# Patient Record
Sex: Male | Born: 1985 | Race: White | Hispanic: No | Marital: Married | Smoking: Never smoker
Health system: Southern US, Community
[De-identification: ages and names within clinical notes are randomized; demographics above are authoritative.]

## PROBLEM LIST (undated history)

## (undated) DIAGNOSIS — E079 Disorder of thyroid, unspecified: Secondary | ICD-10-CM

## (undated) DIAGNOSIS — F419 Anxiety disorder, unspecified: Secondary | ICD-10-CM

---
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
## 2005-04-04 ENCOUNTER — Emergency Department: Payer: Self-pay | Admitting: Emergency Medicine

## 2008-03-09 ENCOUNTER — Ambulatory Visit: Payer: Self-pay | Admitting: Internal Medicine

## 2008-03-09 ENCOUNTER — Inpatient Hospital Stay: Payer: Self-pay | Admitting: General Surgery

## 2009-07-09 ENCOUNTER — Ambulatory Visit: Payer: Self-pay | Admitting: Internal Medicine

## 2009-10-19 IMAGING — CT CT ABD-PELV W/ CM
1 of 2 series · 15 of 32 positions shown, 19 images · non-contrast
Comparison: none

REASON FOR EXAM: abd pain   acute appendicitis
COMMENTS:

[Series 2: soft tissue · axial · 0.73mm/px · z∈[+152,+620]mm · 15 of 170 slices shown, 19 images]
[im 7/170  soft-tissue]
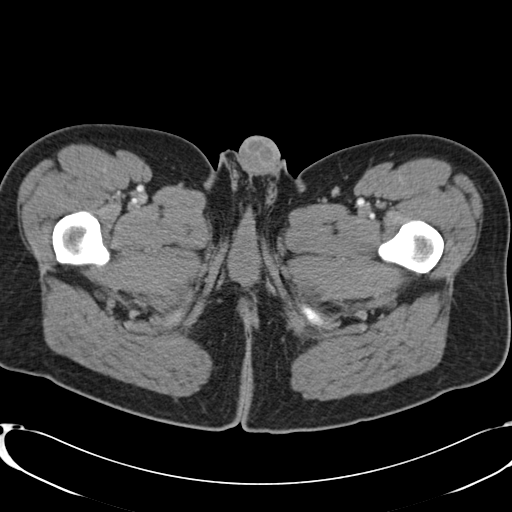
[im 7/170  bone]
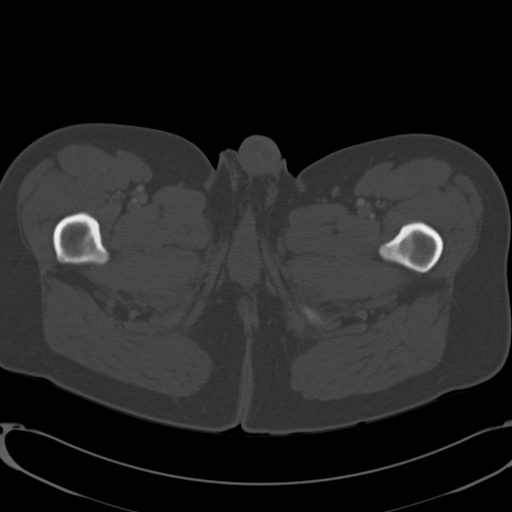
[im 20/170  soft-tissue]
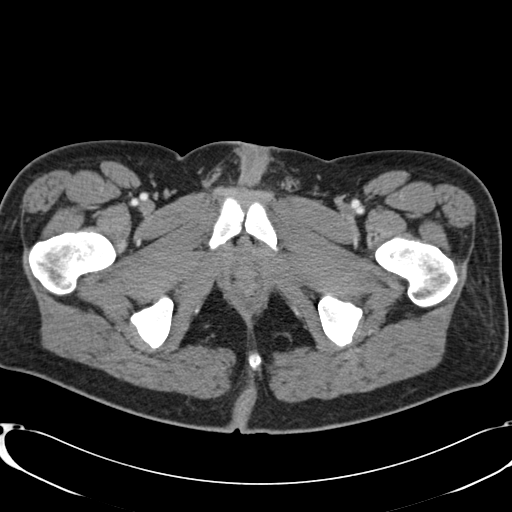
[im 33/170  soft-tissue]
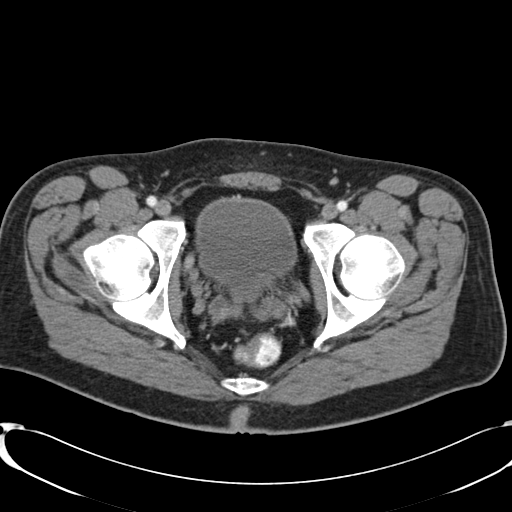
[im 46/170  soft-tissue]
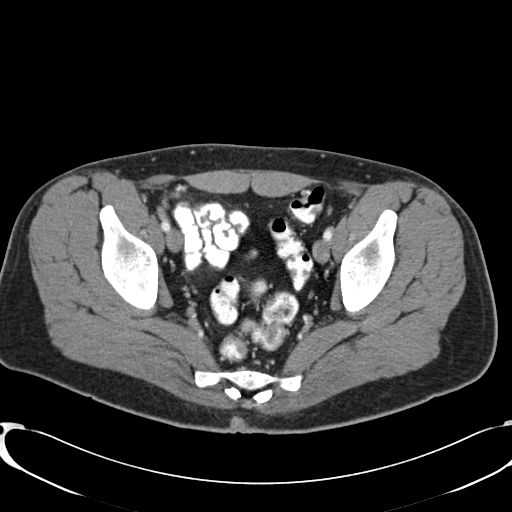
[im 59/170  soft-tissue]
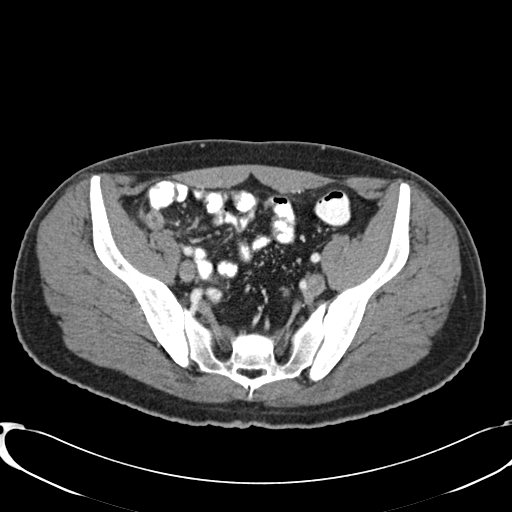
[im 72/170  soft-tissue]
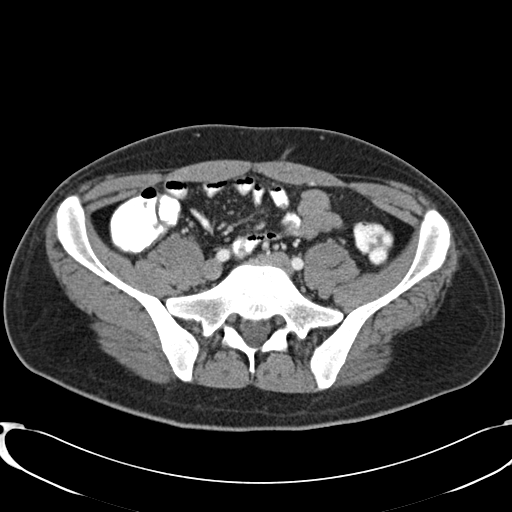
[im 85/170  soft-tissue]
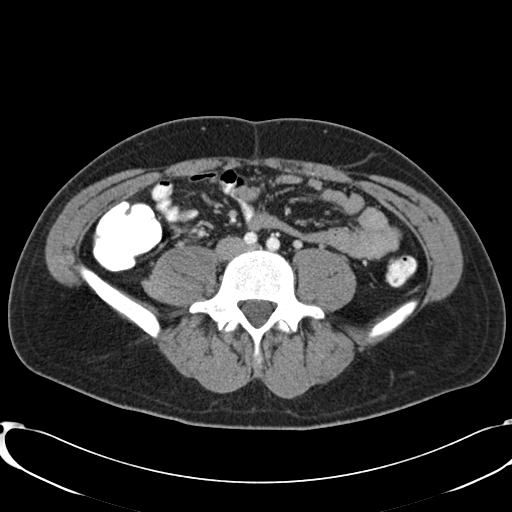
[im 98/170  soft-tissue]
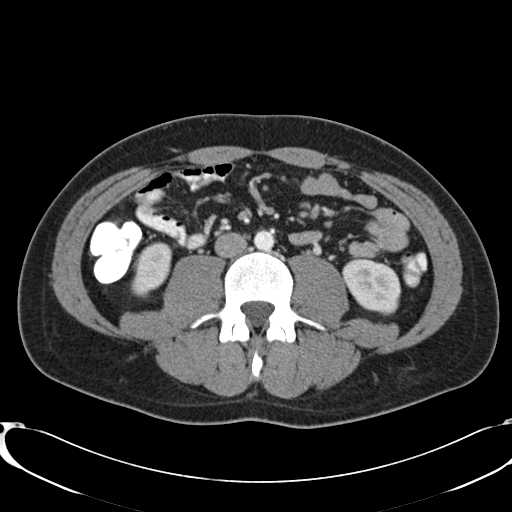
[im 111/170  soft-tissue]
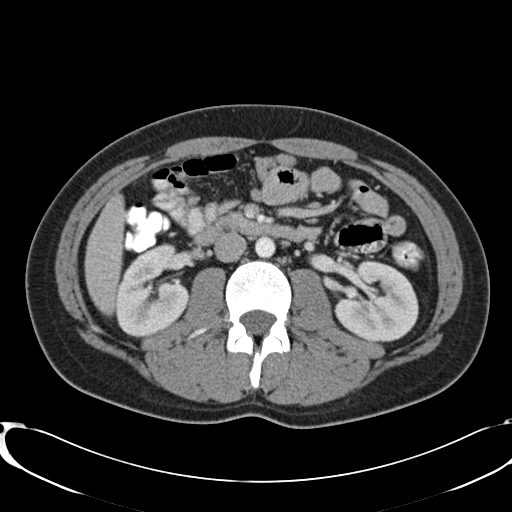
[im 111/170  bone]
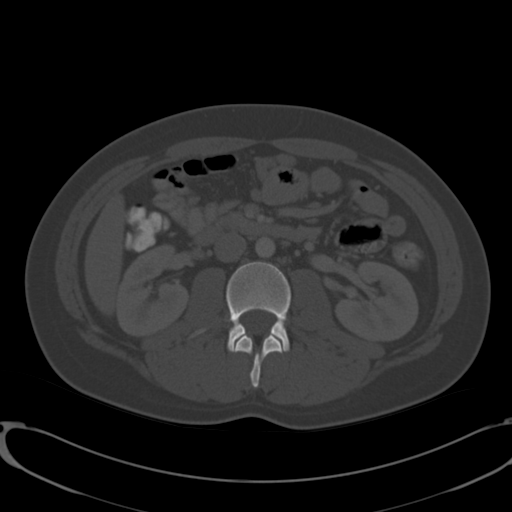
[im 124/170  soft-tissue]
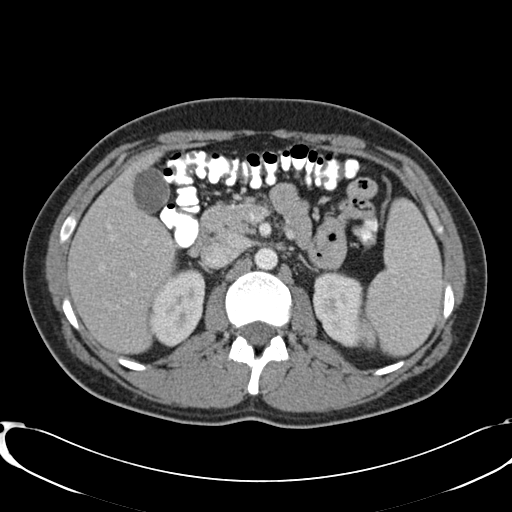
[im 137/170  soft-tissue]
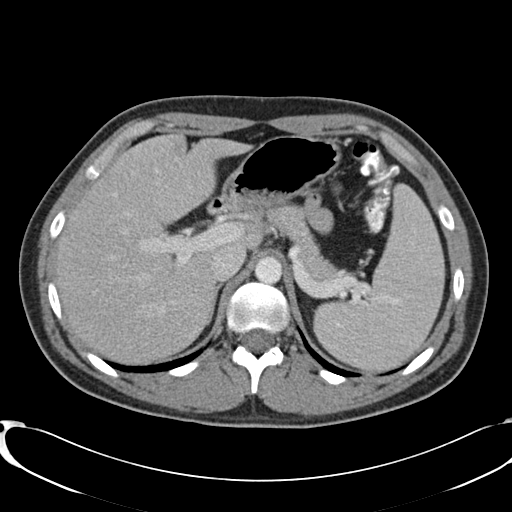
[im 144/170  lung]
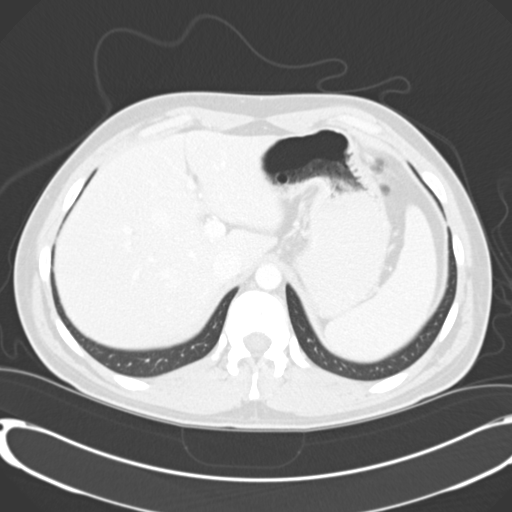
[im 150/170  soft-tissue]
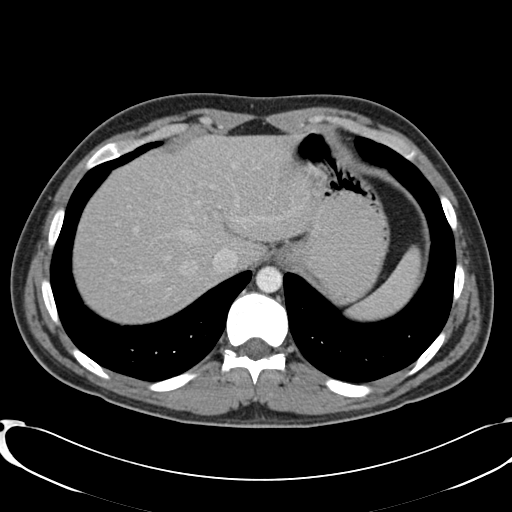
[im 150/170  lung]
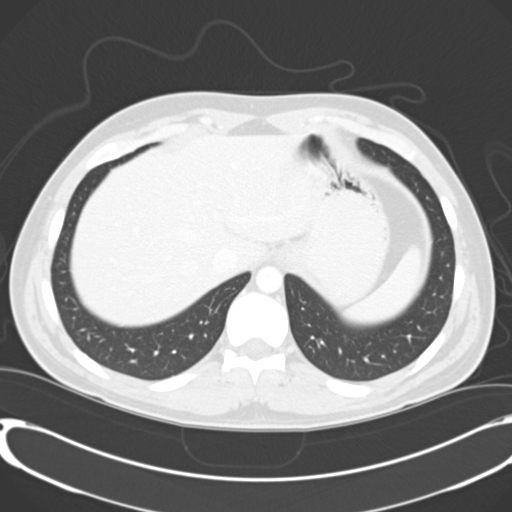
[im 157/170  lung]
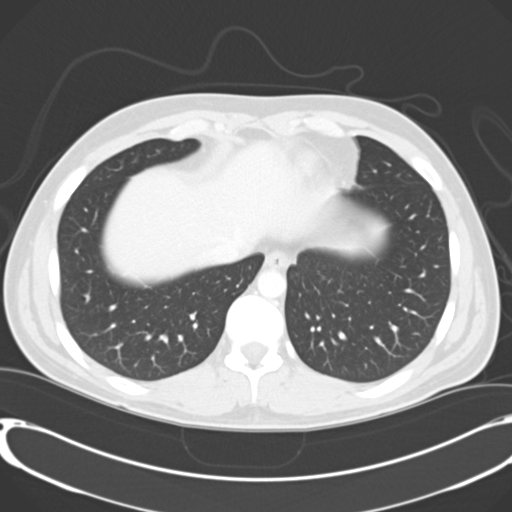
[im 163/170  soft-tissue]
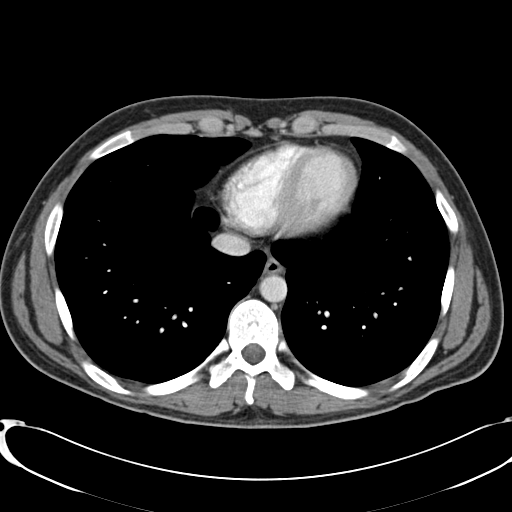
[im 163/170  lung]
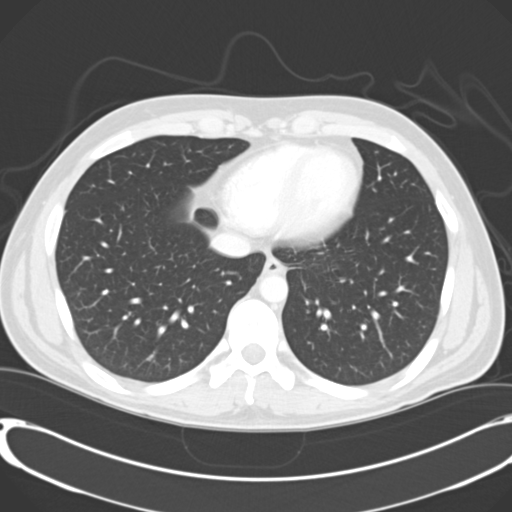

[15 of 32 positions shown; findings below may reference images not displayed]

PROCEDURE:     CT  - CT ABDOMEN / PELVIS  W  - March 09, 2008  [DATE]

RESULT:     Emergent CT of the abdomen and pelvis is performed utilizing 100
ml of Gsovue-ZL3 iodinated intravenous contrast. Oral contrast was also
administered. There is no previous exam for comparison.

Images demonstrate thickening of the appendix with very mild periappendiceal
inflammatory stranding. There is no abnormal fluid collection, free fluid or
free air. The appearance is consistent with acute appendicitis. The urinary
bladder is not abnormally distended. There is no abnormal bowel distention.
The abdominal aorta is unremarkable. The abdominal viscera otherwise appear
to be within normal limits aside from the spleen which is borderline
enlarged. No discrete mass is evident.

The lung bases are clear.
IMPRESSION: 1. Findings consistent with acute appendicitis. There is no evidence of
perforation or abscess formation.
2. Borderline splenomegaly.

## 2011-02-18 IMAGING — CR DG CHEST 2V
1 series · 2 of 2 positions shown · non-contrast
Comparison: none

REASON FOR EXAM: fever/sob
COMMENTS:

PROCEDURE:     DXR - DXR CHEST PA (OR AP) AND LATERAL  - July 09, 2009  [DATE]
RESULT:     The lung fields are clear. No pneumonia, pneumothorax or pleural
effusion is seen. The heart size is normal. The mediastinal and osseous
structures are normal in appearance.

[Series 1: view not recorded · 0.17mm/px · 2 of 2 slices shown]
[im 1/2]
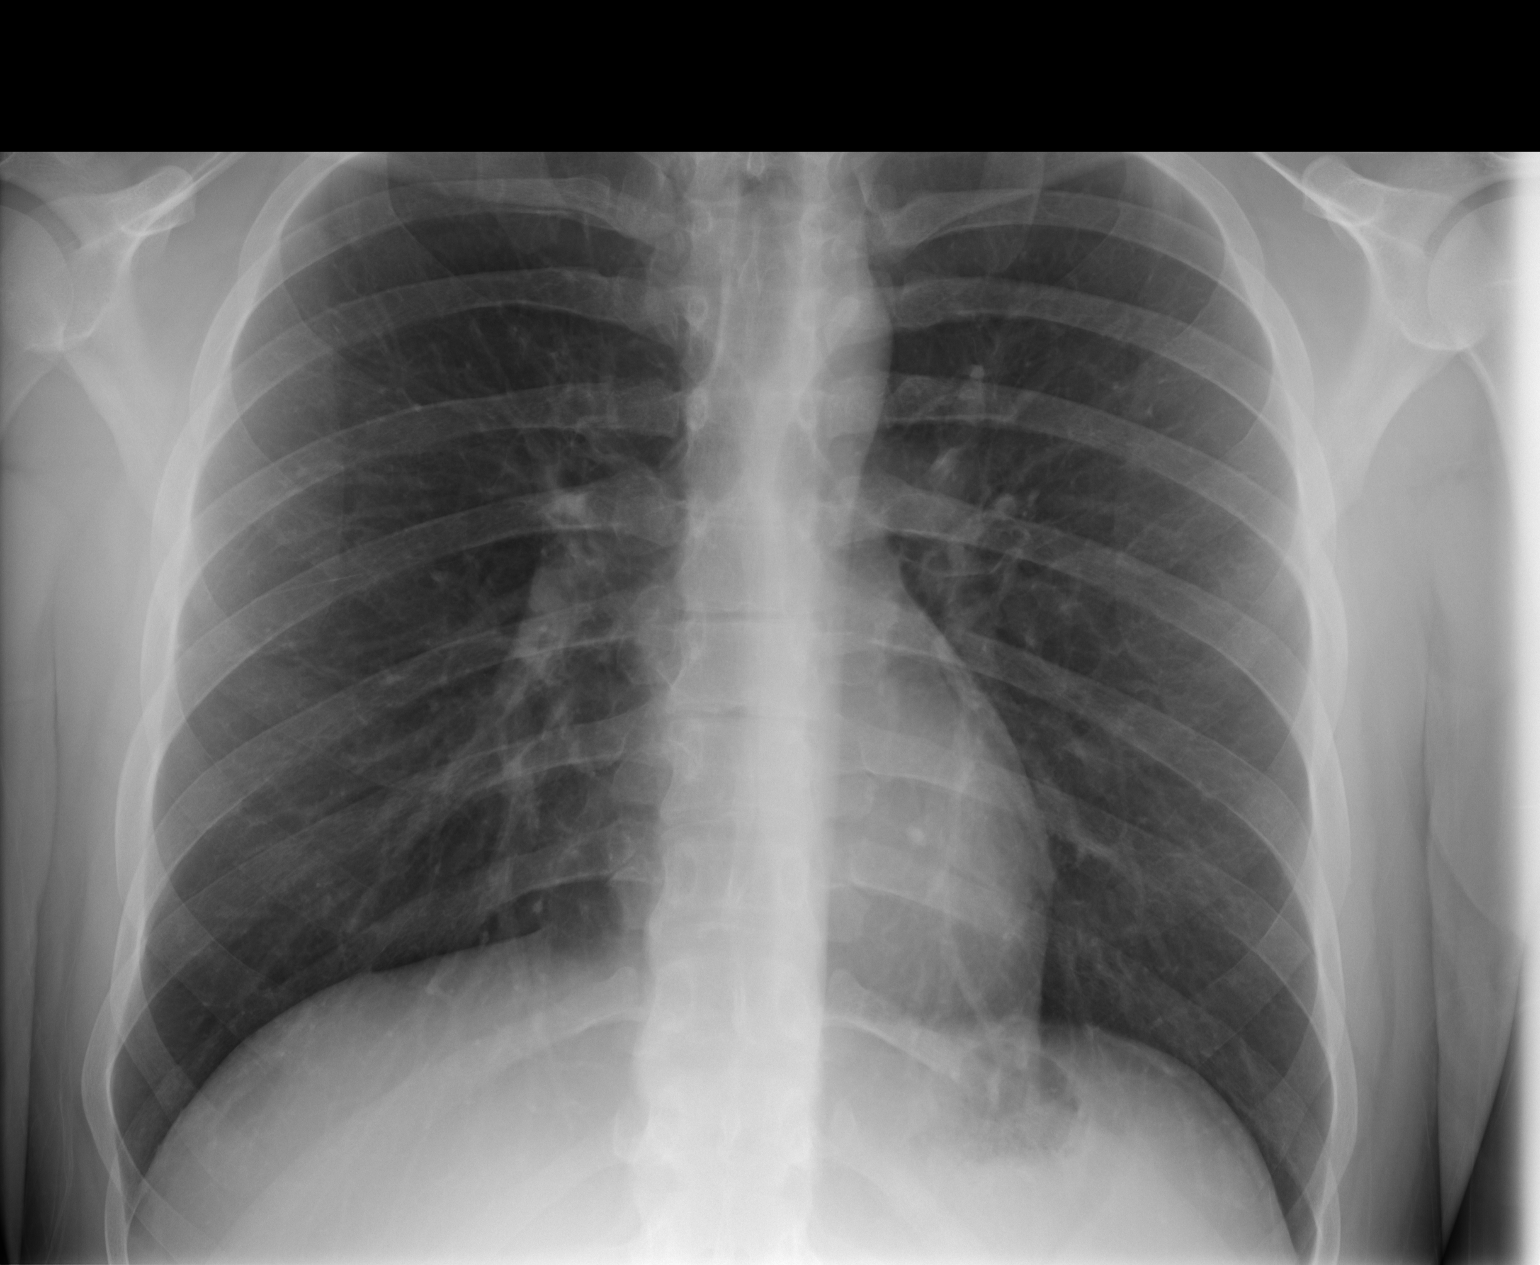
[im 2/2]
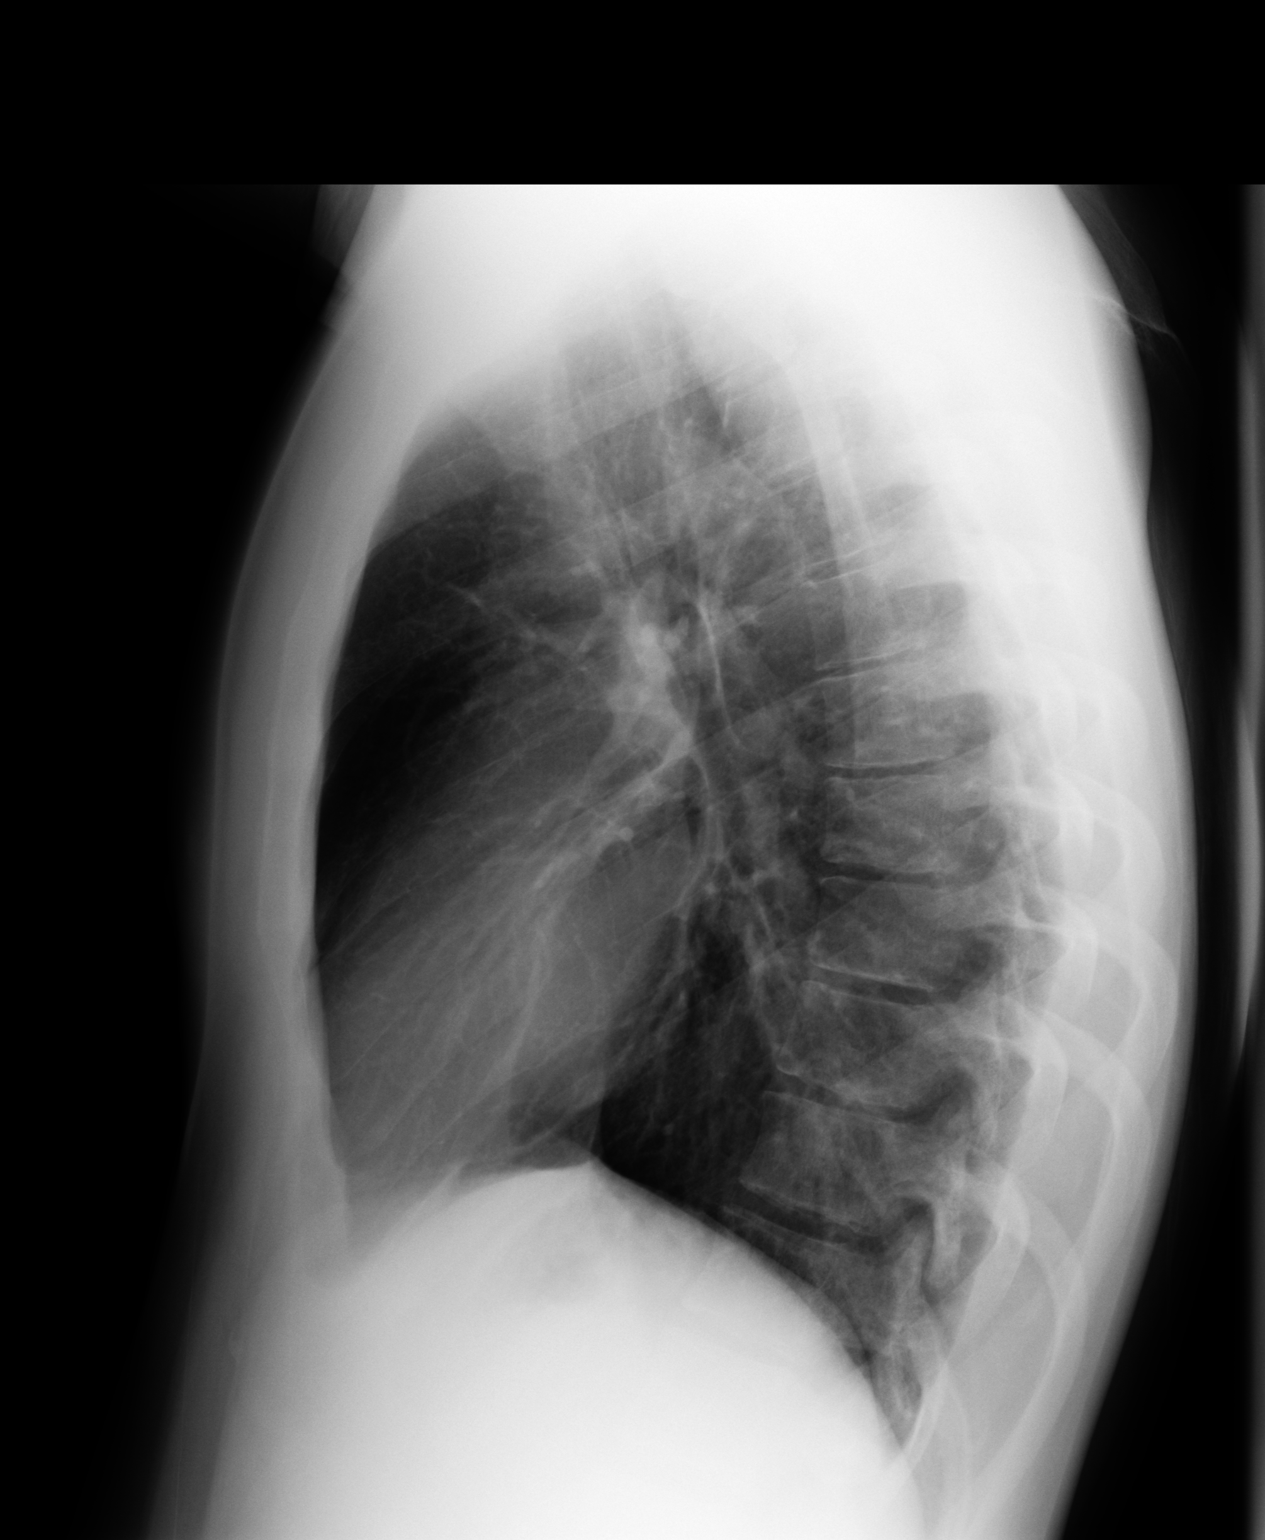

[2 of 2 positions shown; findings below may reference images not displayed]

IMPRESSION: No significant abnormalities are noted.

## 2021-06-05 ENCOUNTER — Ambulatory Visit: Payer: Self-pay | Admitting: Urology

## 2021-06-05 NOTE — Progress Notes (Deleted)
06/05/2021 9:28 AM   Brian Copeland [birth year] [id number]  Referring provider: No referring provider defined for this encounter.  No chief complaint on file.   HPI: 35 y.o. year old male referred for further evaluation of possible vasectomy.  He denies a history of testicular trauma or pain.  No urinary issues.  No previous scrotal surgeries.   PMH: No past medical history on file.  Surgical History: *** The histories are not reviewed yet. Please review them in the "History" navigator section and refresh this SmartLink.  Home Medications:  Allergies as of 06/05/2021   Not on File      Medication List    as of June 05, 2021  9:28 AM   You have not been prescribed any medications.     Allergies: Not on File  Family History: No family history on file.  Social History:  has no history on file for tobacco use, alcohol use, and drug use.   Physical Exam: There were no vitals taken for this visit.  Constitutional:  Alert and oriented, No acute distress. HEENT: [place] AT, moist mucus membranes.  Trachea midline, no masses. Cardiovascular: No clubbing, cyanosis, or edema. Respiratory: Normal respiratory effort, no increased work of breathing. GI: Abdomen is soft, nontender, nondistended, no abdominal masses GU: Normal phallus.  Bilateral descended testicles without masses.  Vasa easily palpable bilaterally. Skin: No rashes, bruises or suspicious lesions. Neurologic: Grossly intact, no focal deficits, moving all 4 extremities. Psychiatric: Normal mood and affect.   Assessment & Plan:    1. Vasectomy evaluation Today, we discussed what the vas deferens is, where it is located, and its function. We reviewed the procedure for vasectomy, it's risks, benefits, alternatives, and likelihood of achieving his goals. We discussed in detail the procedure, complications, and recovery as well as the need for clearance prior to unprotected intercourse. We discussed that vasectomy  does not protect against sexually transmitted diseases. We discussed that this procedure does not result in immediate sterility and that they would need to use other forms of birth control until he has been cleared with negative postvasectomy semen analyses. I explained that the procedure is considered to be permanent and that attempts at reversal have varying degrees of success. These options include vasectomy reversal, sperm retrieval, and in vitro fertilization; these can be very expensive. We discussed the chance of postvasectomy pain syndrome which occurs in less than 5% of patients. I explained to the patient that there is no treatment to resolve this chronic pain, and that if it developed I would not be able to help resolve the issue, but that surgery is generally not needed for correction. I explained there have even been reports of systemic like illness associated with this chronic pain, and that there was no good cure. I explained that vasectomy it is not a 100% reliable form of birth control, and the risk of pregnancy after vasectomy is approximately 1 in 2000 men who had a negative postvasectomy semen analysis or rare non-motile sperm. I explained that repeat vasectomy was necessary in less than 1% of vasectomy procedures when employing the type of technique that I use. I explained that he should refrain from ejaculation for approximately one week following vasectomy. I explained that there are other options for birth control which are permanent and non-permanent; we discussed these. I explained the rates of surgical complications, such as symptomatic hematoma or infection, are low (1-2%) and vary with the surgeon's experience and criteria used to diagnose the complication.  The patient had the opportunity to ask questions to his stated satisfaction. He voiced understanding of the above factors and stated that he has read all the information provided to him and the packets and informed consent.  He  is interested in receiving of Valium 10 mg prior to the procedure for the purpose of anxiolysis.  A prescription was given today.  He will have a driver on the day of the procedure.    Vanna Scotland, MD  [facility] [address], [address] [place], [place] [address] [phone]

## 2021-06-11 ENCOUNTER — Ambulatory Visit (INDEPENDENT_AMBULATORY_CARE_PROVIDER_SITE_OTHER): Payer: No Typology Code available for payment source | Admitting: Urology

## 2021-06-11 ENCOUNTER — Other Ambulatory Visit: Payer: Self-pay

## 2021-06-11 VITALS — BP 139/93 | HR 70 | Ht 76.0 in | Wt 220.0 lb

## 2021-06-11 DIAGNOSIS — Z3009 Encounter for other general counseling and advice on contraception: Secondary | ICD-10-CM

## 2021-06-12 ENCOUNTER — Encounter: Payer: Self-pay | Admitting: Urology

## 2021-06-12 MED ORDER — DIAZEPAM 10 MG PO TABS: 10.0000 mg | ORAL_TABLET | Freq: Once | ORAL | 0 refills | Status: AC

## 2021-06-12 NOTE — Progress Notes (Signed)
06/11/2021 4:23 PM   Brian Copeland Jul 20, 1986 [id number]  Referring provider: No referring provider defined for this encounter.  Chief Complaint  Patient presents with   VAS Consult    HPI: 35 y.o. year old male referred for further evaluation of possible vasectomy.  He denies a history of testicular trauma or pain.  No urinary issues.  No previous scrotal surgeries.  He has 3 biological children ages 67 5 and 2.  He and his wife are on the same page about vasectomy as a primary birth control method being the most appropriate further family-planning.  He does mention that he has a cyst on his left epididymis for which he is previously seen Dr. Sheppard Penton in the remote past.  He has no issues related to this.  PMH: History reviewed. No pertinent past medical history.  Surgical History: History reviewed. No pertinent surgical history.  Home Medications:  Allergies as of 06/11/2021   No Known Allergies      Medication List        Accurate as of June 11, 2021 11:59 PM. If you have any questions, ask your nurse or doctor.          diazepam 10 MG tablet Commonly known as: Valium Take 1 tablet (10 mg total) by mouth once for 1 dose. Started by: Vanna Scotland, MD        Allergies: No Known Allergies  Family History: History reviewed. No pertinent family history.  Social History:  has no history on file for tobacco use, alcohol use, and drug use.   Physical Exam: BP (!) 139/93   Pulse 70   Ht 6\' 4"  (1.93 m)   Wt 220 lb (99.8 kg)   BMI 26.78 kg/m   Constitutional:  Alert and oriented, No acute distress. HEENT: [place] AT, moist mucus membranes.  Trachea midline, no masses. Cardiovascular: No clubbing, cyanosis, or edema. Respiratory: Normal respiratory effort, no increased work of breathing. GI: Abdomen is soft, nontender, nondistended, no abdominal masses GU: Normal phallus.  Bilateral descended testicles without masses.  Vasa easily palpable bilaterally.   Incidental left epididymal cyst palpable today on exam. Skin: No rashes, bruises or suspicious lesions. Neurologic: Grossly intact, no focal deficits, moving all 4 extremities. Psychiatric: Normal mood and affect.   Assessment & Plan:    1. Vasectomy evaluation Today, we discussed what the vas deferens is, where it is located, and its function. We reviewed the procedure for vasectomy, it's risks, benefits, alternatives, and likelihood of achieving his goals. We discussed in detail the procedure, complications, and recovery as well as the need for clearance prior to unprotected intercourse. We discussed that vasectomy does not protect against sexually transmitted diseases. We discussed that this procedure does not result in immediate sterility and that they would need to use other forms of birth control until he has been cleared with negative postvasectomy semen analyses. I explained that the procedure is considered to be permanent and that attempts at reversal have varying degrees of success. These options include vasectomy reversal, sperm retrieval, and in vitro fertilization; these can be very expensive. We discussed the chance of postvasectomy pain syndrome which occurs in less than 5% of patients. I explained to the patient that there is no treatment to resolve this chronic pain, and that if it developed I would not be able to help resolve the issue, but that surgery is generally not needed for correction. I explained there have even been reports of systemic like illness associated with  this chronic pain, and that there was no good cure. I explained that vasectomy it is not a 100% reliable form of birth control, and the risk of pregnancy after vasectomy is approximately 1 in 2000 men who had a negative postvasectomy semen analysis or rare non-motile sperm. I explained that repeat vasectomy was necessary in less than 1% of vasectomy procedures when employing the type of technique that I use. I explained  that he should refrain from ejaculation for approximately one week following vasectomy. I explained that there are other options for birth control which are permanent and non-permanent; we discussed these. I explained the rates of surgical complications, such as symptomatic hematoma or infection, are low (1-2%) and vary with the surgeon's experience and criteria used to diagnose the complication.   The patient had the opportunity to ask questions to his stated satisfaction. He voiced understanding of the above factors and stated that he has read all the information provided to him and the packets and informed consent.  He is interested in receiving of Valium 10 mg prior to the procedure for the purpose of anxiolysis.  A prescription was given today.  He will have a driver on the day of the procedure.   Vanna Scotland, MD  [facility] [address], [address] [place], [place] [address] [phone]

## 2021-06-24 ENCOUNTER — Telehealth: Payer: Self-pay | Admitting: *Deleted

## 2021-06-24 NOTE — Progress Notes (Signed)
@  GMWNU@  CC:  Chief Complaint  Patient presents with   VAS    HPI: 35 y.o. year old male referred for vasectomy.   He denies a history of testicular trauma or pain.  No urinary issues.  No previous scrotal surgeries.      Blood pressure (!) 148/98, pulse 80. NED. A&Ox3.   No respiratory distress   Abd soft, NT, ND Normal external genitalia with patent urethral meatus   Bilateral Vasectomy Procedure  Pre-Procedure: - Patient's scrotum was prepped and draped for vasectomy. -- Confirmed consent and had time to ask all questions  - Time out was performed - The vas was palpated through the scrotal skin on the left. - 1% Xylocaine was injected into the skin and surrounding tissue for placement  - In a similar manner, the vas on the right was identified, anesthetized, and stabilized.  Procedure: - A #11 blade was used to make a small stab incision in the skin overlying the vas - The left vas was isolated and brought up through the incision exposing that structure. - Bleeding points were cauterized as they occurred. - The vas was free from the surrounding structures and brought to the view. - A segment was positioned for placement with a hemostat. - A second hemostat was placed and a small segment between the two hemostats and was removed for inspection. - Each end of the transected vas lumen was fulgurated/ obliterated using needlepoint electrocautery -A fascial interposition was performed on testicular end of the vas using #3-0 chromic suture -The same procedure was performed on the right.  - A single suture of #3-0 chromic catgut was used to close each lateral scrotal skin incision - A dressing was applied.   Post-Procedure: - Patient was instructed in care of the operative area - A specimen is to be delivered in 12 weeks   -Another form of contraception is to be used until post vasectomy semen analysis  Tawni Millers as a scribe for Vanna Scotland, MD.,have  documented all relevant documentation on the behalf of Vanna Scotland, MD,as directed by  Vanna Scotland, MD while in the presence of Vanna Scotland, MD.

## 2021-06-24 NOTE — Telephone Encounter (Signed)
Left voicemail medication was sent to [id number] [place], [place] - [address] AT [facility] [address]  [address] [place], [place] [address]

## 2021-06-24 NOTE — Telephone Encounter (Signed)
Patient would like valium called in before his vasectomy tomorrow.

## 2021-06-24 NOTE — Telephone Encounter (Signed)
It was sent on 7/20 to [facility] #[id number] [place], [place] - [address] AT [facility] [address]  [address] [place], [place] [address]   Vanna Scotland, MD

## 2021-06-25 ENCOUNTER — Other Ambulatory Visit: Payer: Self-pay

## 2021-06-25 ENCOUNTER — Ambulatory Visit (INDEPENDENT_AMBULATORY_CARE_PROVIDER_SITE_OTHER): Payer: No Typology Code available for payment source | Admitting: Urology

## 2021-06-25 VITALS — BP 148/98 | HR 80

## 2021-06-25 DIAGNOSIS — Z3009 Encounter for other general counseling and advice on contraception: Secondary | ICD-10-CM | POA: Diagnosis not present

## 2021-06-25 NOTE — Patient Instructions (Signed)

## 2021-07-02 ENCOUNTER — Encounter: Payer: Self-pay | Admitting: Urology

## 2021-07-02 NOTE — Progress Notes (Signed)
   07/09/21 1:09 PM   Brian Copeland Aug 05, 1986 [id number]  Referring provider:  No referring provider defined for this encounter. No chief complaint on file.    HPI: Brian Copeland is a 35 y.o.male returns today for post vasectomy pain and swelling.   Patient underwent a vasectomy on 06/25/2021.   Papers for the first couple of days, he was good and had no issues.  He then became active with a long day of work 3 days following the procedure that is when he started to have increased pain and swelling particularly in the cord areas.  This remained stable.  He denies any urinary symptoms.  No fevers or chills.  No drainage.  PMH: No past medical history on file.  Surgical History: No past surgical history on file.  Home Medications:  Allergies as of 07/03/2021   No Known Allergies      Medication List        Accurate as of July 03, 2021 11:59 PM. If you have any questions, ask your nurse or doctor.          sulfamethoxazole-trimethoprim 800-160 MG tablet Commonly known as: BACTRIM DS Take 1 tablet by mouth 2 (two) times daily. Started by: Brian Scotland, MD        Allergies: No Known Allergies  Family History: No family history on file.  Social History:  reports that he has never smoked. He has never used smokeless tobacco. He reports that he does not currently use alcohol. No history on file for drug use.   Physical Exam: BP (!) 148/103   Pulse 82   Ht 6\' 4"  (1.93 m)   Wt 220 lb (99.8 kg)   BMI 26.78 kg/m   Constitutional:  Alert and oriented, No acute distress. HEENT: [place] AT, moist mucus membranes.  Trachea midline, no masses. Cardiovascular: No clubbing, cyanosis, or edema. Respiratory: Normal respiratory effort, no increased work of breathing. GU CVA tenderness and hematoma, incision healed, bilateral areas of location of vasectomy measuring about 1 cm by half a centimeter which is mildly tender without overlying skin changes.  No drainage.  No  erythema. Skin: No rashes, bruises or suspicious lesions. Neurologic: Grossly intact, no focal deficits, moving all 4 extremities. Psychiatric: Normal mood and affect   Assessment & Plan:    Scrotal pain -Continue scrotal supportive care -Exam today most suggestive of cord hematomas bilaterally without concern for infection -I did go ahead and give him a prescription for Bactrim to hold if his pain becomes worse, the swelling increases or he develops any erythema as a precaution.  He will only take it if he has the aforementioned symptoms.   Follow-up as needed.   as a Tawni Millers for Neurosurgeon, MD.,have documented all relevant documentation on the behalf of Brian Scotland, MD,as directed by  Brian Scotland, MD while in the presence of Brian Scotland, MD.  I have reviewed the above documentation for accuracy and completeness, and I agree with the above.   Brian Scotland, MD   [facility] [address] [place], [place] [phone]

## 2021-07-02 NOTE — Telephone Encounter (Signed)
Left VM to return my call

## 2021-07-02 NOTE — Telephone Encounter (Signed)
Incoming call from pt's wife, she wishes to schedule him a post op vas appt for pain and swelling. Per DPR ok to speak with wife. She states that the patient did not ice or wear supportive underwear he is now having significant pain and swelling in the scrotum. Advised wife on the use of ice (not directly on skin) and tylenol and NSAIDS for pain relief. Appt scheduled.

## 2021-07-03 ENCOUNTER — Encounter: Payer: Self-pay | Admitting: Urology

## 2021-07-03 ENCOUNTER — Ambulatory Visit (INDEPENDENT_AMBULATORY_CARE_PROVIDER_SITE_OTHER): Payer: No Typology Code available for payment source | Admitting: Urology

## 2021-07-03 ENCOUNTER — Other Ambulatory Visit: Payer: Self-pay

## 2021-07-03 VITALS — BP 148/103 | HR 82 | Ht 76.0 in | Wt 220.0 lb

## 2021-07-03 DIAGNOSIS — N5082 Scrotal pain: Secondary | ICD-10-CM

## 2021-07-03 MED ORDER — SULFAMETHOXAZOLE-TRIMETHOPRIM 800-160 MG PO TABS: 1.0000 | ORAL_TABLET | Freq: Two times a day (BID) | ORAL | 0 refills | Status: AC

## 2021-07-03 NOTE — Patient Instructions (Signed)
Take Bactrim if not improving in a couple of days

## 2021-09-23 ENCOUNTER — Encounter: Payer: Self-pay | Admitting: Urology

## 2021-09-26 ENCOUNTER — Other Ambulatory Visit: Payer: Self-pay

## 2021-09-26 ENCOUNTER — Other Ambulatory Visit: Payer: No Typology Code available for payment source

## 2021-09-26 DIAGNOSIS — Z3009 Encounter for other general counseling and advice on contraception: Secondary | ICD-10-CM

## 2021-09-28 LAB — POST-VAS SPERM EVALUATION,QUAL: Volume: 2.2 mL

## 2021-09-30 ENCOUNTER — Telehealth: Payer: Self-pay | Admitting: *Deleted

## 2021-09-30 NOTE — Telephone Encounter (Signed)
-----   Message from Vanna Scotland, MD sent at 09/30/2021  7:51 AM EST ----- No sperm, he is good to go.  Vanna Scotland, MD

## 2021-09-30 NOTE — Telephone Encounter (Signed)
Notified patient as instructed,.  

## 2023-04-28 ENCOUNTER — Emergency Department
Admission: EM | Admit: 2023-04-28 | Discharge: 2023-04-28 | Disposition: A | Discharge status: Home / Self Care | Payer: No Typology Code available for payment source | Attending: Student in an Organized Health Care Education/Training Program | Admitting: Student in an Organized Health Care Education/Training Program

## 2023-04-28 DIAGNOSIS — W260XXA Contact with knife, initial encounter: Secondary | ICD-10-CM | POA: Insufficient documentation

## 2023-04-28 DIAGNOSIS — S61210A Laceration without foreign body of right index finger without damage to nail, initial encounter: Secondary | ICD-10-CM | POA: Diagnosis not present

## 2023-04-28 DIAGNOSIS — S6991XA Unspecified injury of right wrist, hand and finger(s), initial encounter: Secondary | ICD-10-CM | POA: Diagnosis present

## 2023-04-28 MED ORDER — LIDOCAINE HCL (PF) 1 % IJ SOLN
5.0000 mL | Freq: Once | INTRAMUSCULAR | Status: DC
  Filled 2023-04-28: qty 5

## 2023-04-28 NOTE — Discharge Instructions (Signed)
You were evaluated in the emergency department for a laceration. It was repaired with sutures. Keep the area clean and dry.  Wash multiple times per day with soap and water.  Do not go into the ocean or swimming pool.  Return to the emergency department or your primary care physician's office in 7 days for suture removal.  Return to the emergency department for:  -- Fever > 100.4F -- Increase pain in the wound -- Increase redness and swelling -- Pus coming from the wound -- Wound bleeds more than a small amount or it does not stop -- Wound edges come apart -- Severe pain -- Weakness or numbness in the affected area  Or any other new or worsening symptoms. It was a pleasure caring for you.  

## 2023-04-28 NOTE — ED Triage Notes (Signed)
Pt presents to the ED due to a hand injury. Pt states he cut his R index finger on a pocket knife. Pt states his tetanus is  UTD

## 2023-04-28 NOTE — ED Provider Notes (Signed)
[facility] Provider Note    Event Date/Time   First MD Initiated Contact with Patient 04/28/23 1555     (approximate)   History   Hand Injury   HPI  Brian Copeland is a 37 y.o. male who presents today for evaluation of right index finger laceration.  Patient reports that he was opening his pocket knife and it snapped forward and sliced his finger.  He denies numbness or tingling.  He reports that his tetanus is up-to-date.  He denies paresthesias.  There are no problems to display for this patient.         Physical Exam   Triage Vital Signs: ED Triage Vitals  Enc Vitals Group     BP 04/28/23 1553 (!) 162/103     Pulse Rate 04/28/23 1553 66     Resp 04/28/23 1553 16     Temp 04/28/23 1553 97.6 F (36.4 C)     Temp Source 04/28/23 1553 Oral     SpO2 04/28/23 1553 100 %     Weight 04/28/23 1550 200 lb (90.7 kg)     Height --      Head Circumference --      Peak Flow --      Pain Score 04/28/23 1549 4     Pain Loc --      Pain Edu? --      Excl. in GC? --     Most recent vital signs: Vitals:   04/28/23 1553  BP: (!) 162/103  Pulse: 66  Resp: 16  Temp: 97.6 F (36.4 C)  SpO2: 100%    Physical Exam Vitals and nursing note reviewed.  Constitutional:      General: Awake and alert. No acute distress.    Appearance: Normal appearance. The patient is normal weight.  HENT:     Head: Normocephalic and atraumatic.     Mouth: Mucous membranes are moist.  Eyes:     General: PERRL. Normal EOMs        Right eye: No discharge.        Left eye: No discharge.     Conjunctiva/sclera: Conjunctivae normal.  Cardiovascular:     Rate and Rhythm: Normal rate and regular rhythm.     Pulses: Normal pulses.  Pulmonary:     Effort: Pulmonary effort is normal. No respiratory distress.     Breath sounds: Normal breath sounds.  Abdominal:     Abdomen is soft. There is no abdominal tenderness. No rebound or guarding. No distention. Musculoskeletal:         General: No swelling. Normal range of motion.     Cervical back: Normal range of motion and neck supple.  Right hand: 2.5 cm laceration to the dorsum of the index finger, overlying the proximal phalanx.  Patient able to flex and extend at isolated DIP and PIP against resistance.  Normal grip strength.  Normal intrinsic muscle function of the hand. Skin:    General: Skin is warm and dry.     Capillary Refill: Capillary refill takes less than 2 seconds.     Findings: No rash.  Neurological:     Mental Status: The patient is awake and alert.      ED Results / Procedures / Treatments   Labs (all labs ordered are listed, but only abnormal results are displayed) Labs Reviewed - No data to display   EKG     RADIOLOGY I independently reviewed and interpreted imaging and agree with  radiologists findings.     PROCEDURES:  Critical Care performed:   Procedures   MEDICATIONS ORDERED IN ED: Medications  lidocaine (PF) (XYLOCAINE) 1 % injection 5 mL (has no administration in time range)     IMPRESSION / MDM / ASSESSMENT AND PLAN / ED COURSE  I reviewed the triage vital signs and the nursing notes.   Differential diagnosis includes, but is not limited to, laceration, tendon injury, bony injury, retained foreign body.  Patient is awake and alert, hemodynamically stable and afebrile.  His sensation is intact to light touch throughout.  He is able to flex and extend at isolated PIP and DIP and MCP against resistance, I do not suspect tendon injury.  No bony tenderness, exam not consistent with bony injury.  Full base of wound is visualized, no retained foreign body. Patient is neurovascularly intact. No fingernail or nailbed involvement. Wound fully probed and evaluated, no retained foreign body.  Wound was anesthetized and irrigated, closed with sutures.  We discussed suture care and timeline for removal.  Tdap up-to-date.  Patient understands and agrees with plan.  He was  discharged in stable condition.   Patient's presentation is most consistent with acute complicated illness / injury requiring diagnostic workup.   FINAL CLINICAL IMPRESSION(S) / ED DIAGNOSES   Final diagnoses:  Laceration of right index finger, foreign body presence unspecified, nail damage status unspecified, initial encounter     Rx / DC Orders   ED Discharge Orders     None        Note:  This document was prepared using Dragon voice recognition software and may include unintentional dictation errors.   Keturah Shavers 04/28/23 1708    Willy Eddy, MD 04/28/23 [phone]

## 2024-08-03 ENCOUNTER — Other Ambulatory Visit: Payer: Self-pay | Admitting: Family Medicine

## 2024-08-03 DIAGNOSIS — E039 Hypothyroidism, unspecified: Secondary | ICD-10-CM

## 2024-08-03 DIAGNOSIS — E7800 Pure hypercholesterolemia, unspecified: Secondary | ICD-10-CM

## 2024-09-02 ENCOUNTER — Ambulatory Visit
Admission: RE | Admit: 2024-09-02 | Discharge: 2024-09-02 | Disposition: A | Discharge status: Home / Self Care | Payer: Self-pay | Source: Ambulatory Visit

## 2024-09-02 VITALS — BP 138/92 | Temp 97.9°F | Resp 18

## 2024-09-02 DIAGNOSIS — K130 Diseases of lips: Secondary | ICD-10-CM

## 2024-09-02 HISTORY — DX: Disorder of thyroid, unspecified: E07.9

## 2024-09-02 HISTORY — DX: Anxiety disorder, unspecified: F41.9

## 2024-09-02 MED ORDER — LIDOCAINE VISCOUS HCL 2 % MT SOLN: 15.0000 mL | OROMUCOSAL | 0 refills | Status: AC | PRN

## 2024-09-02 MED ORDER — AMOXICILLIN-POT CLAVULANATE 875-125 MG PO TABS: 1.0000 | ORAL_TABLET | Freq: Two times a day (BID) | ORAL | 0 refills | Status: AC

## 2024-09-02 NOTE — ED Provider Notes (Signed)
 Brian Copeland    CSN: [id number] Arrival date & time: 09/02/24  1447      History   Chief Complaint Chief Complaint  Patient presents with   Mouth Lesions    Sore in my mouth for a week, hurts, doesn't seem to be getting better... - Entered by patient    HPI Brian Copeland is a 38 y.o. male.   Patient presents for evaluation of the lesion present to the lower lip beginning 10 days ago.  Does not recall injury or trauma to the area.  Site is painful making it difficult to eat and pain interfering with sleep.  Attempted salt water gargle which exacerbated symptoms.  Has noticed purulent and bloody drainage.  Denies fever.  Past Medical History:  Diagnosis Date   Anxiety    Thyroid disease     There are no active problems to display for this patient.   History reviewed. No pertinent surgical history.     Home Medications    Prior to Admission medications   Medication Sig Start Date End Date Taking? Authorizing Provider  amoxicillin-clavulanate (AUGMENTIN) 875-125 MG tablet Take 1 tablet by mouth every 12 (twelve) hours. 09/02/24  Yes Fantasia Jinkins R, NP  lidocaine  (XYLOCAINE ) 2 % solution Use as directed 15 mLs in the mouth or throat every 4 (four) hours as needed. 09/02/24  Yes Jennea Rager R, NP  escitalopram (LEXAPRO) 10 MG tablet Take 10 mg by mouth daily.    [provider]  levothyroxine (SYNTHROID) 125 MCG tablet Take 125 mcg by mouth daily.    [provider]  sulfamethoxazole -trimethoprim  (BACTRIM  DS) 800-160 MG tablet Take 1 tablet by mouth 2 (two) times daily. 07/03/21   Penne Knee, MD    Family History History reviewed. No pertinent family history.  Social History Social History   Tobacco Use   Smoking status: Never   Smokeless tobacco: Never  Vaping Use   Vaping status: Never Used  Substance Use Topics   Alcohol use: Not Currently   Drug use: Never     Allergies   Patient has no known allergies.   Review  of Systems Review of Systems   Physical Exam Triage Vital Signs ED Triage Vitals  Encounter Vitals Group     BP 09/02/24 1501 (!) 138/92     Girls Systolic BP Percentile --      Girls Diastolic BP Percentile --      Boys Systolic BP Percentile --      Boys Diastolic BP Percentile --      Pulse --      Resp 09/02/24 1501 18     Temp 09/02/24 1501 97.9 F (36.6 C)     Temp Source 09/02/24 1501 Oral     SpO2 09/02/24 1501 98 %     Weight --      Height --      Head Circumference --      Peak Flow --      Pain Score 09/02/24 1456 7     Pain Loc --      Pain Education --      Exclude from Growth Chart --    No data found.  Updated Vital Signs BP (!) 138/92 (BP Location: Left Arm)   Temp 97.9 F (36.6 C) (Oral)   Resp 18   SpO2 98%   Visual Acuity Right Eye Distance:   Left Eye Distance:   Bilateral Distance:    Right Eye Near:  Left Eye Near:    Bilateral Near:     Physical Exam Constitutional:      Appearance: Normal appearance.  HENT:     Mouth/Throat:      Comments: Ulceration present to the right side of the lower lip, [name] granulation tissue, tender to palpation with surrounding erythema Eyes:     Extraocular Movements: Extraocular movements intact.  Neurological:     Mental Status: He is alert.      UC Treatments / Results  Labs (all labs ordered are listed, but only abnormal results are displayed) Labs Reviewed - No data to display  EKG   Radiology No results found.  Procedures Procedures (including critical care time)  Medications Ordered in UC Medications - No data to display  Initial Impression / Assessment and Plan / UC Course  I have reviewed the triage vital signs and the nursing notes.  Pertinent labs & imaging results that were available during my care of the patient were reviewed by me and considered in my medical decision making (see chart for details).  Lip lesion  Appears consistent with ulceration, endorsing pus and  bloody drainage, persisting greater than 7 days without signs of resolution most likely infected, prescribed Augmentin and viscous lidocaine  for supportive care, recommended avoidance of irritants with a bland diet and advised follow-up for persisting symptoms Final Clinical Impressions(s) / UC Diagnoses   Final diagnoses:  Lip lesion     Discharge Instructions      Today you are evaluated for your lip lesion and as it has continued to persist for greater than a week without healing and you have noticed pus and blood these are all concerning for infection and you will be started on antibiotics  Take Augmentin twice daily for 7 days for treatment  May spot treat or gargle and spit lidocaine  solution as needed for temporary relief of her pain  Avoid spicy, acidic, greasy and citrus based products as this may cause further irritation, try to eat a blander diet until symptoms begin to resolve  May follow-up with urgent care as needed for any further concern   ED Prescriptions     Medication Sig Dispense Auth. Provider   amoxicillin-clavulanate (AUGMENTIN) 875-125 MG tablet Take 1 tablet by mouth every 12 (twelve) hours. 14 tablet Marilu Rylander R, NP   lidocaine  (XYLOCAINE ) 2 % solution Use as directed 15 mLs in the mouth or throat every 4 (four) hours as needed. 100 mL Teresa Shelba SAUNDERS, NP      PDMP not reviewed this encounter.   Teresa Shelba SAUNDERS, NP 09/02/24 1525

## 2024-09-02 NOTE — ED Triage Notes (Signed)
 Patient reports lesion on left lower lip x 1.5 week. Patient now complains pain to lower lip. Rates pain 7/10. Patient has used OTC mouth wash with no relief.

## 2024-09-02 NOTE — Discharge Instructions (Signed)
 Today you are evaluated for your lip lesion and as it has continued to persist for greater than a week without healing and you have noticed pus and blood these are all concerning for infection and you will be started on antibiotics  Take Augmentin twice daily for 7 days for treatment  May spot treat or gargle and spit lidocaine  solution as needed for temporary relief of her pain  Avoid spicy, acidic, greasy and citrus based products as this may cause further irritation, try to eat a blander diet until symptoms begin to resolve  May follow-up with urgent care as needed for any further concern

## 2024-09-07 ENCOUNTER — Ambulatory Visit
Admission: RE | Admit: 2024-09-07 | Discharge: 2024-09-07 | Disposition: A | Discharge status: Home / Self Care | Payer: Self-pay | Source: Ambulatory Visit | Attending: Family Medicine | Admitting: Family Medicine

## 2024-09-07 DIAGNOSIS — E7800 Pure hypercholesterolemia, unspecified: Secondary | ICD-10-CM | POA: Insufficient documentation

## 2024-09-07 DIAGNOSIS — E039 Hypothyroidism, unspecified: Secondary | ICD-10-CM | POA: Insufficient documentation
